# Patient Record
Sex: Male | Born: 1995 | Race: White | Hispanic: No | Marital: Single | Smoking: Current some day smoker
Health system: Southern US, Community
[De-identification: ages and names within clinical notes are randomized; demographics above are authoritative.]

## PROBLEM LIST (undated history)

## (undated) ENCOUNTER — Emergency Department (HOSPITAL_COMMUNITY): Admission: EM | Payer: Self-pay

## (undated) DIAGNOSIS — R112 Nausea with vomiting, unspecified: Secondary | ICD-10-CM

## (undated) DIAGNOSIS — Z789 Other specified health status: Secondary | ICD-10-CM

## (undated) DIAGNOSIS — Z9889 Other specified postprocedural states: Secondary | ICD-10-CM

## (undated) HISTORY — PX: MYRINGOTOMY: SUR874

---
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
## 2019-05-04 ENCOUNTER — Other Ambulatory Visit: Payer: Self-pay

## 2019-05-04 DIAGNOSIS — Z20822 Contact with and (suspected) exposure to covid-19: Secondary | ICD-10-CM

## 2019-05-05 LAB — NOVEL CORONAVIRUS, NAA: SARS-CoV-2, NAA: NOT DETECTED

## 2019-05-06 ENCOUNTER — Telehealth: Payer: Self-pay | Admitting: *Deleted

## 2019-05-06 NOTE — Telephone Encounter (Signed)
Patient calling for results- notified negative. Patient was tested for low grade temp and sore throat. Patient reports symptoms are better now. Advised to monitor symptoms- contact PCP for follow up if needed, retest if necessary, and get flu shot when available.

## 2019-10-08 ENCOUNTER — Other Ambulatory Visit: Payer: Self-pay

## 2019-10-08 ENCOUNTER — Ambulatory Visit
Admission: EM | Admit: 2019-10-08 | Discharge: 2019-10-08 | Disposition: A | Discharge status: Home / Self Care | Payer: 59 | Attending: Physician Assistant | Admitting: Physician Assistant

## 2019-10-08 DIAGNOSIS — Z202 Contact with and (suspected) exposure to infections with a predominantly sexual mode of transmission: Secondary | ICD-10-CM | POA: Insufficient documentation

## 2019-10-08 MED ORDER — AZITHROMYCIN 500 MG PO TABS
1000.0000 mg | ORAL_TABLET | Freq: Once | ORAL | Status: AC
  Administered 2019-10-08: 16:00:00 1000 mg via ORAL

## 2019-10-08 NOTE — ED Triage Notes (Signed)
Pt states his gf is chlamydia positive, pt denies any sx's

## 2019-10-08 NOTE — Discharge Instructions (Addendum)
You were treated empirically for chlamydia. Azithromycin 1000mg  given in office today. Cytology sent, you will be contacted with any positive results that requires further treatment. Refrain from sexual activity and alcohol use for the next 7 days. If developing testicular swelling/pain, penile lesion/sore, follow up for reevaluation.

## 2019-10-08 NOTE — ED Provider Notes (Signed)
[facility]    CSN: [id number] Arrival date & time: 10/08/19  1514      History   Chief Complaint Chief Complaint  Patient presents with  . Exposure to STD    HPI David Daugherty is a 24 y.o. male.   23 comes in for STD testing. Girlfriend tested positive for chlamydia. He is asymptomatic. Denies fever, chills, body aches. Denies abdominal pain, nausea, vomiting. Denies urinary symptoms such as frequency, dysuria, hematuria. Denies penile discharge, penile sore/ulcer, testicular swelling, testicular pain. Sexually active with one male partner.       History reviewed. No pertinent past medical history.  There are no problems to display for this patient.   Past Surgical History:  Procedure Laterality Date  . MYRINGOTOMY         Home Medications    Prior to Admission medications   Not on File    Family History Family History  Problem Relation Age of Onset  . Diabetes Mother     Social History Social History   Tobacco Use  . Smoking status: Current Every Day Smoker  . Smokeless tobacco: Never Used  Substance Use Topics  . Alcohol use: No  . Drug use: No     Allergies   Bee venom   Review of Systems Review of Systems  Reason unable to perform ROS: See HPI as above.     Physical Exam Triage Vital Signs ED Triage Vitals  Enc Vitals Group     BP 10/08/19 1525 (!) 152/74     Pulse Rate 10/08/19 1525 83     Resp 10/08/19 1525 16     Temp 10/08/19 1525 98.4 F (36.9 C)     Temp Source 10/08/19 1525 Oral     SpO2 10/08/19 1525 98 %     Weight --      Height --      Head Circumference --      Peak Flow --      Pain Score 10/08/19 1533 0     Pain Loc --      Pain Edu? --      Excl. in GC? --    No data found.  Updated Vital Signs BP (!) 152/74 (BP Location: Left Arm)   Pulse 83   Temp 98.4 F (36.9 C) (Oral)   Resp 16   SpO2 98%   Physical Exam Constitutional:      General: He is not in acute distress.  Appearance: Normal appearance. He is well-developed. He is not toxic-appearing or diaphoretic.  HENT:     Head: Normocephalic and atraumatic.  Eyes:     Conjunctiva/sclera: Conjunctivae normal.     Pupils: Pupils are equal, round, and reactive to light.  Pulmonary:     Effort: Pulmonary effort is normal. No respiratory distress.     Comments: Speaking in full sentences without difficulty Musculoskeletal:     Cervical back: Normal range of motion and neck supple.  Skin:    General: Skin is warm and dry.  Neurological:     Mental Status: He is alert and oriented to person, place, and time.    UC Treatments / Results  Labs (all labs ordered are listed, but only abnormal results are displayed) Labs Reviewed  CYTOLOGY, (ORAL, ANAL, URETHRAL) ANCILLARY ONLY    EKG   Radiology No results found.  Procedures Procedures (including critical care time)  Medications Ordered in UC Medications  azithromycin (ZITHROMAX) tablet 1,000 mg (1,000 mg Oral Given  10/08/19 1550)    Initial Impression / Assessment and Plan / UC Course  I have reviewed the triage vital signs and the nursing notes.  Pertinent labs & imaging results that were available during my care of the patient were reviewed by me and considered in my medical decision making (see chart for details).    Patient was treated empirically for chlamydia. Azithromycin given in office today. Cytology sent, patient will be contacted with any positive results that require additional treatment. Patient to refrain from sexual activity for the next 7 days. Return precautions given.   Final Clinical Impressions(s) / UC Diagnoses   Final diagnoses:  STD exposure   ED Prescriptions    None     PDMP not reviewed this encounter.   Ok Edwards, PA-C 10/08/19 1554

## 2019-10-12 ENCOUNTER — Telehealth (HOSPITAL_COMMUNITY): Payer: Self-pay | Admitting: Emergency Medicine

## 2019-10-12 LAB — CYTOLOGY, (ORAL, ANAL, URETHRAL) ANCILLARY ONLY
Chlamydia: POSITIVE — AB
Neisseria Gonorrhea: NEGATIVE
Trichomonas: NEGATIVE

## 2019-10-12 NOTE — Telephone Encounter (Signed)
Chlamydia is positive.  This was treated at the urgent care visit with po zithromax 1g.  Please refrain from sexual intercourse for 7 days to give the medicine time to work.  Sexual partners need to be notified and tested/treated.  Condoms may reduce risk of reinfection.  Recheck or followup with PCP for further evaluation if symptoms are not improving.  [facility] notified.  Attempted to reach patient. No answer at this time. Voicemail left.

## 2019-10-12 NOTE — Telephone Encounter (Signed)
Patient contacted by phone and made aware of  cytology  results. Pt verbalized understanding and had all questions answered.    

## 2019-12-18 ENCOUNTER — Ambulatory Visit
Admission: EM | Admit: 2019-12-18 | Discharge: 2019-12-18 | Disposition: A | Discharge status: Home / Self Care | Payer: 59 | Attending: Physician Assistant | Admitting: Physician Assistant

## 2019-12-18 ENCOUNTER — Other Ambulatory Visit: Payer: Self-pay

## 2019-12-18 DIAGNOSIS — T1592XA Foreign body on external eye, part unspecified, left eye, initial encounter: Secondary | ICD-10-CM | POA: Diagnosis not present

## 2019-12-18 MED ORDER — OFLOXACIN 0.3 % OP SOLN: 1.0000 [drp] | OPHTHALMIC | 0 refills | Status: AC

## 2019-12-18 MED ORDER — SYSTANE 0.4-0.3 % OP GEL: 1.0000 "application " | Freq: Every evening | OPHTHALMIC | 0 refills | Status: AC | PRN

## 2019-12-18 MED ORDER — TETRACAINE HCL 0.5 % OP SOLN
1.0000 [drp] | Freq: Once | OPHTHALMIC | Status: AC
  Administered 2019-12-18: 14:00:00 1 [drp] via OPHTHALMIC

## 2019-12-18 NOTE — ED Provider Notes (Addendum)
[facility]    CSN: [id number] Arrival date & time: 12/18/19  1318      History   Chief Complaint Chief Complaint  Patient presents with  . Eye Problem    foreign body     HPI David Daugherty is a 24 y.o. male.   24 year old male comes in for 3-day history of possible foreign body in the left eye.  Was working in the car when he felt foreign body sensation to the left eye, eye pain, photophobia.  Denies vision changes.  States now with eye redness, and watering.  Tried irrigating eye with tap water without relief.  Denies contact lens wear glasses use.     History reviewed. No pertinent past medical history.  There are no problems to display for this patient.   Past Surgical History:  Procedure Laterality Date  . MYRINGOTOMY         Home Medications    Prior to Admission medications   Medication Sig Start Date End Date Taking? Authorizing Provider  ofloxacin (OCUFLOX) 0.3 % ophthalmic solution Place 1 drop into the left eye every 2 (two) hours. 12/18/19   Cathie Hoops, Genia Perin V, PA-C  Polyethyl Glycol-Propyl Glycol (SYSTANE) 0.4-0.3 % GEL ophthalmic gel Place 1 application into both eyes at bedtime as needed. 12/18/19   Belinda Fisher, PA-C    Family History Family History  Problem Relation Age of Onset  . Diabetes Mother     Social History Social History   Tobacco Use  . Smoking status: Current Some Day Smoker  . Smokeless tobacco: Never Used  Substance Use Topics  . Alcohol use: Yes    Comment: occassional  . Drug use: No     Allergies   Bee venom   Review of Systems Review of Systems  Reason unable to perform ROS: See HPI as above.     Physical Exam Triage Vital Signs ED Triage Vitals  Enc Vitals Group     BP 12/18/19 1323 128/88     Pulse Rate 12/18/19 1323 84     Resp 12/18/19 1323 16     Temp 12/18/19 1323 98.3 F (36.8 C)     Temp Source 12/18/19 1323 Oral     SpO2 12/18/19 1323 97 %     Weight --      Height --      Head Circumference  --      Peak Flow --      Pain Score 12/18/19 1331 1     Pain Loc --      Pain Edu? --      Excl. in GC? --    No data found.  Updated Vital Signs BP 128/88 (BP Location: Left Arm)   Pulse 84   Temp 98.3 F (36.8 C) (Oral)   Resp 16   SpO2 97%   Visual Acuity Right Eye Distance:   Left Eye Distance:   Bilateral Distance:    Right Eye Near: R Near: 20/30 Left Eye Near:  L Near: 20/25 Bilateral Near:  20/20  Physical Exam Constitutional:      General: He is not in acute distress.    Appearance: He is well-developed.  HENT:     Head: Normocephalic and atraumatic.  Eyes:     Extraocular Movements: Extraocular movements intact.     Conjunctiva/sclera:     Right eye: Right conjunctiva is not injected.     Left eye: Left conjunctiva is injected.     Pupils: Pupils  are equal, round, and reactive to light.     Comments: Foreign body without obvious rust ring to left eye 10 o'clock region of the iris, this does not extend to the pupil.  Foreign body approximately 0.2 cm.  Fluorescein stain without uptake.  Negative Seidel sign  Musculoskeletal:     Cervical back: Normal range of motion and neck supple.  Skin:    General: Skin is warm and dry.  Neurological:     Mental Status: He is alert and oriented to person, place, and time.      UC Treatments / Results  Labs (all labs ordered are listed, but only abnormal results are displayed) Labs Reviewed - No data to display  EKG   Radiology No results found.  Procedures Procedures (including critical care time)  Medications Ordered in UC Medications  tetracaine (PONTOCAINE) 0.5 % ophthalmic solution 1 drop (has no administration in time range)    Initial Impression / Assessment and Plan / UC Course  I have reviewed the triage vital signs and the nursing notes.  Pertinent labs & imaging results that were available during my care of the patient were reviewed by me and considered in my medical decision making (see  chart for details).    Attempted foreign body removal with cotton swab without success.  Tetracaine drop given in office for symptomatic management.  Will start patient on ofloxacin every 2 hours while awake until seen by ophthalmology in 2 days for foreign body removal.  Other symptomatic treatment discussed.  Return precautions given.  Patient expresses understanding and agrees to plan.  Final Clinical Impressions(s) / UC Diagnoses   Final diagnoses:  Foreign body, eye, left, initial encounter   ED Prescriptions    Medication Sig Dispense Auth. Provider   ofloxacin (OCUFLOX) 0.3 % ophthalmic solution Place 1 drop into the left eye every 2 (two) hours. 5 mL Jodell Weitman V, PA-C   Polyethyl Glycol-Propyl Glycol (SYSTANE) 0.4-0.3 % GEL ophthalmic gel Place 1 application into both eyes at bedtime as needed. 8 mL Ok Edwards, PA-C     PDMP not reviewed this encounter.   Ok Edwards, PA-C 12/18/19 1356    Cathlean Sauer V, PA-C 12/18/19 1400

## 2019-12-18 NOTE — ED Triage Notes (Signed)
Patient presents with complaint of foreign body in left eye. He reports he was working on a car on Thursday morning when it occurred. Left eye now watery and feels irritated.

## 2019-12-18 NOTE — Discharge Instructions (Signed)
Use ofloxacin eyedrops as directed on left eye. Artificial tear gel at night. Wait 10-15 minutes between drops, always use artificial tear gel last, as it prevents drops from penetrating through. Monitor for any worsening of symptoms, changes in vision, sensitivity to light, eye swelling, painful eye movement, follow up with ophthalmology for further evaluation. Follow up with ophthalmology in 2 days for foreign body removal.

## 2022-12-02 ENCOUNTER — Other Ambulatory Visit: Payer: Self-pay | Admitting: Podiatry

## 2022-12-02 DIAGNOSIS — S93325A Dislocation of tarsometatarsal joint of left foot, initial encounter: Secondary | ICD-10-CM

## 2022-12-03 ENCOUNTER — Ambulatory Visit
Admission: RE | Admit: 2022-12-03 | Discharge: 2022-12-03 | Disposition: A | Discharge status: Home / Self Care | Payer: 59 | Source: Ambulatory Visit | Attending: Podiatry | Admitting: Podiatry

## 2022-12-03 DIAGNOSIS — S93325A Dislocation of tarsometatarsal joint of left foot, initial encounter: Secondary | ICD-10-CM

## 2022-12-04 ENCOUNTER — Other Ambulatory Visit: Payer: Self-pay | Admitting: Podiatry

## 2022-12-06 ENCOUNTER — Other Ambulatory Visit: Payer: Self-pay

## 2022-12-06 ENCOUNTER — Ambulatory Visit: Payer: 59 | Admitting: Anesthesiology

## 2022-12-06 ENCOUNTER — Ambulatory Visit: Payer: 59

## 2022-12-06 ENCOUNTER — Encounter
Admission: RE | Disposition: A | Discharge status: Home / Self Care | Payer: Self-pay | Source: Home / Self Care | Attending: Podiatry

## 2022-12-06 ENCOUNTER — Ambulatory Visit
Admission: RE | Admit: 2022-12-06 | Discharge: 2022-12-06 | Disposition: A | Discharge status: Home / Self Care | Payer: 59 | Attending: Podiatry | Admitting: Podiatry

## 2022-12-06 ENCOUNTER — Encounter: Payer: Self-pay | Admitting: Podiatry

## 2022-12-06 DIAGNOSIS — S92352A Displaced fracture of fifth metatarsal bone, left foot, initial encounter for closed fracture: Secondary | ICD-10-CM | POA: Diagnosis not present

## 2022-12-06 DIAGNOSIS — S92332A Displaced fracture of third metatarsal bone, left foot, initial encounter for closed fracture: Secondary | ICD-10-CM | POA: Diagnosis present

## 2022-12-06 DIAGNOSIS — X58XXXA Exposure to other specified factors, initial encounter: Secondary | ICD-10-CM | POA: Insufficient documentation

## 2022-12-06 DIAGNOSIS — Z01818 Encounter for other preprocedural examination: Secondary | ICD-10-CM

## 2022-12-06 DIAGNOSIS — F17200 Nicotine dependence, unspecified, uncomplicated: Secondary | ICD-10-CM | POA: Diagnosis not present

## 2022-12-06 DIAGNOSIS — S8252XA Displaced fracture of medial malleolus of left tibia, initial encounter for closed fracture: Secondary | ICD-10-CM | POA: Diagnosis not present

## 2022-12-06 DIAGNOSIS — S92342A Displaced fracture of fourth metatarsal bone, left foot, initial encounter for closed fracture: Secondary | ICD-10-CM | POA: Diagnosis not present

## 2022-12-06 DIAGNOSIS — S92322A Displaced fracture of second metatarsal bone, left foot, initial encounter for closed fracture: Secondary | ICD-10-CM | POA: Insufficient documentation

## 2022-12-06 DIAGNOSIS — S8252XD Displaced fracture of medial malleolus of left tibia, subsequent encounter for closed fracture with routine healing: Secondary | ICD-10-CM

## 2022-12-06 DIAGNOSIS — S93325A Dislocation of tarsometatarsal joint of left foot, initial encounter: Secondary | ICD-10-CM | POA: Insufficient documentation

## 2022-12-06 DIAGNOSIS — S93325D Dislocation of tarsometatarsal joint of left foot, subsequent encounter: Secondary | ICD-10-CM

## 2022-12-06 HISTORY — DX: Other specified postprocedural states: Z98.890

## 2022-12-06 HISTORY — PX: OPEN REDUCTION INTERNAL FIXATION (ORIF) FOOT LISFRANC FRACTURE: SHX5990

## 2022-12-06 HISTORY — DX: Nausea with vomiting, unspecified: R11.2

## 2022-12-06 HISTORY — PX: ORIF ANKLE FRACTURE: SHX5408

## 2022-12-06 SURGERY — OPEN REDUCTION INTERNAL FIXATION (ORIF) ANKLE FRACTURE
Anesthesia: Regional | Site: Foot | Laterality: Left

## 2022-12-06 MED ORDER — ONDANSETRON HCL 4 MG/2ML IJ SOLN
INTRAMUSCULAR | Status: DC | PRN
  Administered 2022-12-06: 4 mg via INTRAVENOUS

## 2022-12-06 MED ORDER — ORAL CARE MOUTH RINSE: 15.0000 mL | Freq: Once | OROMUCOSAL | Status: AC

## 2022-12-06 MED ORDER — DEXAMETHASONE SODIUM PHOSPHATE 10 MG/ML IJ SOLN
INTRAMUSCULAR | Status: DC | PRN
  Administered 2022-12-06: 10 mg via INTRAVENOUS

## 2022-12-06 MED ORDER — ONDANSETRON HCL 4 MG/2ML IJ SOLN
4.0000 mg | Freq: Once | INTRAMUSCULAR | Status: AC | PRN
  Administered 2022-12-06: 4 mg via INTRAVENOUS

## 2022-12-06 MED ORDER — ACETAMINOPHEN 10 MG/ML IV SOLN
INTRAVENOUS | Status: DC | PRN
  Administered 2022-12-06: 1000 mg via INTRAVENOUS

## 2022-12-06 MED ORDER — LIDOCAINE HCL (PF) 1 % IJ SOLN
INTRAMUSCULAR | Status: AC
  Filled 2022-12-06: qty 30

## 2022-12-06 MED ORDER — DEXAMETHASONE SODIUM PHOSPHATE 10 MG/ML IJ SOLN
INTRAMUSCULAR | Status: AC
  Filled 2022-12-06: qty 1

## 2022-12-06 MED ORDER — LIDOCAINE HCL (PF) 2 % IJ SOLN
INTRAMUSCULAR | Status: AC
  Filled 2022-12-06: qty 5

## 2022-12-06 MED ORDER — FENTANYL CITRATE PF 50 MCG/ML IJ SOSY
PREFILLED_SYRINGE | INTRAMUSCULAR | Status: AC
  Administered 2022-12-06: 50 ug via INTRAVENOUS
  Filled 2022-12-06: qty 1

## 2022-12-06 MED ORDER — FENTANYL CITRATE (PF) 100 MCG/2ML IJ SOLN
INTRAMUSCULAR | Status: AC
  Filled 2022-12-06: qty 2

## 2022-12-06 MED ORDER — OXYCODONE HCL 5 MG PO TABS: 5.0000 mg | ORAL_TABLET | Freq: Once | ORAL | Status: DC | PRN

## 2022-12-06 MED ORDER — BACITRACIN ZINC 500 UNIT/GM EX OINT
TOPICAL_OINTMENT | CUTANEOUS | Status: DC | PRN
  Administered 2022-12-06: 1 via TOPICAL

## 2022-12-06 MED ORDER — MIDAZOLAM HCL 2 MG/2ML IJ SOLN
INTRAMUSCULAR | Status: AC
  Filled 2022-12-06: qty 2

## 2022-12-06 MED ORDER — ROCURONIUM BROMIDE 100 MG/10ML IV SOLN
INTRAVENOUS | Status: DC | PRN
  Administered 2022-12-06: 50 mg via INTRAVENOUS
  Administered 2022-12-06 (×2): 20 mg via INTRAVENOUS
  Administered 2022-12-06: 30 mg via INTRAVENOUS
  Administered 2022-12-06: 20 mg via INTRAVENOUS

## 2022-12-06 MED ORDER — SUGAMMADEX SODIUM 200 MG/2ML IV SOLN
INTRAVENOUS | Status: DC | PRN
  Administered 2022-12-06: 200 mg via INTRAVENOUS

## 2022-12-06 MED ORDER — BUPIVACAINE HCL (PF) 0.5 % IJ SOLN
INTRAMUSCULAR | Status: AC
  Filled 2022-12-06: qty 30

## 2022-12-06 MED ORDER — DROPERIDOL 2.5 MG/ML IJ SOLN
INTRAMUSCULAR | Status: AC
  Filled 2022-12-06: qty 2

## 2022-12-06 MED ORDER — MIDAZOLAM HCL 2 MG/2ML IJ SOLN
INTRAMUSCULAR | Status: AC
  Administered 2022-12-06: 1 mg via INTRAVENOUS
  Filled 2022-12-06: qty 2

## 2022-12-06 MED ORDER — PROPOFOL 10 MG/ML IV BOLUS
INTRAVENOUS | Status: AC
  Filled 2022-12-06: qty 20

## 2022-12-06 MED ORDER — FENTANYL CITRATE (PF) 100 MCG/2ML IJ SOLN: 25.0000 ug | INTRAMUSCULAR | Status: DC | PRN

## 2022-12-06 MED ORDER — BUPIVACAINE HCL (PF) 0.5 % IJ SOLN
INTRAMUSCULAR | Status: DC | PRN
  Administered 2022-12-06: 10 mL

## 2022-12-06 MED ORDER — BUPIVACAINE LIPOSOME 1.3 % IJ SUSP
INTRAMUSCULAR | Status: DC | PRN
  Administered 2022-12-06: 20 mL

## 2022-12-06 MED ORDER — CEFAZOLIN SODIUM-DEXTROSE 2-4 GM/100ML-% IV SOLN
INTRAVENOUS | Status: AC
  Filled 2022-12-06: qty 100

## 2022-12-06 MED ORDER — LACTATED RINGERS IV SOLN: INTRAVENOUS | Status: DC

## 2022-12-06 MED ORDER — ONDANSETRON HCL 4 MG PO TABS: 4.0000 mg | ORAL_TABLET | Freq: Three times a day (TID) | ORAL | 0 refills | Status: AC | PRN

## 2022-12-06 MED ORDER — ROCURONIUM BROMIDE 10 MG/ML (PF) SYRINGE
PREFILLED_SYRINGE | INTRAVENOUS | Status: AC
  Filled 2022-12-06: qty 20

## 2022-12-06 MED ORDER — SODIUM CHLORIDE (PF) 0.9 % IJ SOLN
INTRAMUSCULAR | Status: AC
  Filled 2022-12-06: qty 50

## 2022-12-06 MED ORDER — FENTANYL CITRATE (PF) 100 MCG/2ML IJ SOLN
INTRAMUSCULAR | Status: DC | PRN
  Administered 2022-12-06 (×2): 50 ug via INTRAVENOUS

## 2022-12-06 MED ORDER — DEXMEDETOMIDINE HCL IN NACL 200 MCG/50ML IV SOLN
INTRAVENOUS | Status: DC | PRN
  Administered 2022-12-06: 4 ug via INTRAVENOUS
  Administered 2022-12-06 (×3): 8 ug via INTRAVENOUS

## 2022-12-06 MED ORDER — ONDANSETRON HCL 4 MG/2ML IJ SOLN
INTRAMUSCULAR | Status: AC
  Filled 2022-12-06: qty 2

## 2022-12-06 MED ORDER — ASPIRIN 81 MG PO TBEC: 81.0000 mg | DELAYED_RELEASE_TABLET | Freq: Two times a day (BID) | ORAL | 0 refills | Status: AC

## 2022-12-06 MED ORDER — 0.9 % SODIUM CHLORIDE (POUR BTL) OPTIME
TOPICAL | Status: DC | PRN
  Administered 2022-12-06: 1000 mL

## 2022-12-06 MED ORDER — CHLORHEXIDINE GLUCONATE 0.12 % MT SOLN: 15.0000 mL | Freq: Once | OROMUCOSAL | Status: AC

## 2022-12-06 MED ORDER — CHLORHEXIDINE GLUCONATE 0.12 % MT SOLN
OROMUCOSAL | Status: AC
  Administered 2022-12-06: 15 mL via OROMUCOSAL
  Filled 2022-12-06: qty 15

## 2022-12-06 MED ORDER — ACETAMINOPHEN 10 MG/ML IV SOLN: 1000.0000 mg | Freq: Once | INTRAVENOUS | Status: DC | PRN

## 2022-12-06 MED ORDER — OXYCODONE-ACETAMINOPHEN 5-325 MG PO TABS: 1.0000 | ORAL_TABLET | Freq: Four times a day (QID) | ORAL | 0 refills | Status: AC | PRN

## 2022-12-06 MED ORDER — OXYCODONE HCL 5 MG/5ML PO SOLN: 5.0000 mg | Freq: Once | ORAL | Status: DC | PRN

## 2022-12-06 MED ORDER — FENTANYL CITRATE PF 50 MCG/ML IJ SOSY: 50.0000 ug | PREFILLED_SYRINGE | Freq: Once | INTRAMUSCULAR | Status: AC

## 2022-12-06 MED ORDER — PROMETHAZINE HCL 25 MG/ML IJ SOLN: 6.2500 mg | Freq: Once | INTRAMUSCULAR | Status: DC | PRN

## 2022-12-06 MED ORDER — LIDOCAINE HCL (CARDIAC) PF 100 MG/5ML IV SOSY
PREFILLED_SYRINGE | INTRAVENOUS | Status: DC | PRN
  Administered 2022-12-06: 80 mg via INTRAVENOUS

## 2022-12-06 MED ORDER — CEFAZOLIN SODIUM-DEXTROSE 2-4 GM/100ML-% IV SOLN
2.0000 g | INTRAVENOUS | Status: AC
  Administered 2022-12-06 (×2): 2 g via INTRAVENOUS

## 2022-12-06 MED ORDER — DEXMEDETOMIDINE HCL IN NACL 80 MCG/20ML IV SOLN
INTRAVENOUS | Status: AC
  Filled 2022-12-06: qty 20

## 2022-12-06 MED ORDER — MIDAZOLAM HCL 2 MG/2ML IJ SOLN
1.0000 mg | INTRAMUSCULAR | Status: AC | PRN
  Administered 2022-12-06: 1 mg via INTRAVENOUS

## 2022-12-06 MED ORDER — PROPOFOL 10 MG/ML IV BOLUS
INTRAVENOUS | Status: DC | PRN
  Administered 2022-12-06: 150 mg via INTRAVENOUS

## 2022-12-06 MED ORDER — BUPIVACAINE LIPOSOME 1.3 % IJ SUSP
INTRAMUSCULAR | Status: AC
  Filled 2022-12-06: qty 20

## 2022-12-06 MED ORDER — ACETAMINOPHEN 10 MG/ML IV SOLN
INTRAVENOUS | Status: AC
  Filled 2022-12-06: qty 100

## 2022-12-06 MED ORDER — BUPIVACAINE HCL (PF) 0.5 % IJ SOLN
INTRAMUSCULAR | Status: AC
  Filled 2022-12-06: qty 10

## 2022-12-06 MED ORDER — DROPERIDOL 2.5 MG/ML IJ SOLN
0.6250 mg | Freq: Once | INTRAMUSCULAR | Status: AC
  Administered 2022-12-06: 0.625 mg via INTRAVENOUS

## 2022-12-06 SURGICAL SUPPLY — 73 items
3.5 Countersink IMPLANT
BIT DRILL 1.3 (BIT) ×2
BIT DRILL 100X1.3XAO CNCT (BIT) IMPLANT
BIT DRILL CANNULATED 2.3MM (DRILL) IMPLANT
BIT DRILL CANNULTD 2.6 X 130MM (DRILL) IMPLANT
BIT DRL 100X1.3XAO CNCT (BIT) ×2
BLADE SURG 15 STRL LF DISP TIS (BLADE) IMPLANT
BLADE SURG 15 STRL SS (BLADE) ×2
BNDG CMPR 5X4 CHSV STRCH STRL (GAUZE/BANDAGES/DRESSINGS) ×2
BNDG CMPR 75X41 PLY HI ABS (GAUZE/BANDAGES/DRESSINGS) ×2
BNDG CMPR STD VLCR NS LF 5.8X4 (GAUZE/BANDAGES/DRESSINGS) ×2
BNDG COHESIVE 4X5 TAN STRL LF (GAUZE/BANDAGES/DRESSINGS) ×2 IMPLANT
BNDG ELASTIC 4X5.8 VLCR NS LF (GAUZE/BANDAGES/DRESSINGS) ×2 IMPLANT
BNDG ELASTIC 4X5.8 VLCR STR LF (GAUZE/BANDAGES/DRESSINGS) IMPLANT
BNDG ESMARCH 4 X 12 STRL LF (GAUZE/BANDAGES/DRESSINGS) ×2
BNDG ESMARCH 4X12 STRL LF (GAUZE/BANDAGES/DRESSINGS) ×2 IMPLANT
BNDG GAUZE DERMACEA FLUFF 4 (GAUZE/BANDAGES/DRESSINGS) ×2 IMPLANT
BNDG GZE 12X3 1 PLY HI ABS (GAUZE/BANDAGES/DRESSINGS) ×4
BNDG GZE DERMACEA 4 6PLY (GAUZE/BANDAGES/DRESSINGS)
BNDG STRETCH 4X75 STRL LF (GAUZE/BANDAGES/DRESSINGS) IMPLANT
BNDG STRETCH GAUZE 3IN X12FT (GAUZE/BANDAGES/DRESSINGS) ×2 IMPLANT
COUNTERSICK 4.0 HEADED (MISCELLANEOUS) ×2
COUNTERSINK HEADED 3.5 (ORTHOPEDIC DISPOSABLE SUPPLIES) ×2
DRAPE C-ARM XRAY 36X54 (DRAPES) ×2 IMPLANT
DRAPE C-ARMOR (DRAPES) ×2 IMPLANT
DRILL CANNULATED 2.3MM (DRILL) ×2
DRILL CANNULATED 2.6 X 130MM (DRILL) ×2
DURAPREP 26ML APPLICATOR (WOUND CARE) ×2 IMPLANT
ELECT REM PT RETURN 9FT ADLT (ELECTROSURGICAL) ×2
ELECTRODE REM PT RTRN 9FT ADLT (ELECTROSURGICAL) ×2 IMPLANT
GAUZE SPONGE 4X4 12PLY STRL (GAUZE/BANDAGES/DRESSINGS) ×2 IMPLANT
GAUZE XEROFORM 1X8 LF (GAUZE/BANDAGES/DRESSINGS) ×2 IMPLANT
GLOVE BIO SURGEON STRL SZ7 (GLOVE) ×2 IMPLANT
GLOVE INDICATOR 7.0 STRL GRN (GLOVE) ×2 IMPLANT
GOWN STRL REUS W/ TWL LRG LVL3 (GOWN DISPOSABLE) ×6 IMPLANT
GOWN STRL REUS W/TWL LRG LVL3 (GOWN DISPOSABLE) ×6
K-WIRE SMOOTH 1.6X150MM (WIRE) ×6
K-WIRE SNGL END 1.2X150 (MISCELLANEOUS) ×10
KIT TURNOVER KIT A (KITS) ×2 IMPLANT
KWIRE SMOOTH 1.6X150MM (WIRE) IMPLANT
KWIRE SNGL END 1.2X150 (MISCELLANEOUS) IMPLANT
LABEL OR SOLS (LABEL) ×2 IMPLANT
LOOP VESSEL MINI 0.8X406 BLUE (MISCELLANEOUS) IMPLANT
MANIFOLD NEPTUNE II (INSTRUMENTS) ×2 IMPLANT
NS IRRIG 500ML POUR BTL (IV SOLUTION) ×2 IMPLANT
PACK EXTREMITY ARMC (MISCELLANEOUS) ×2 IMPLANT
PAD ABD DERMACEA PRESS 5X9 (GAUZE/BANDAGES/DRESSINGS) IMPLANT
PAD PREP 24X41 OB/GYN DISP (PERSONAL CARE ITEMS) ×2 IMPLANT
PIN BALLS 3/8 F/.045 WIRE (MISCELLANEOUS) IMPLANT
PLATE T 13 HOLE (Plate) IMPLANT
SCREW 4.0X50 LONG THREAD (Screw) IMPLANT
SCREW CANN HEAD ST 3.5X32 (Screw) IMPLANT
SCREW COUNTERSINK 4.0 HEADED (MISCELLANEOUS) IMPLANT
SCREW COUNTERSINK HEADED 3.5 (ORTHOPEDIC DISPOSABLE SUPPLIES) IMPLANT
SCREW HEAD SHORT 3.5X40 (Screw) IMPLANT
SCREW LOCK  2.0X11 (Screw) ×8 IMPLANT
SCREW LOCK 2.0X11 (Screw) IMPLANT
SCREW LOCKING 2.0X12 (Screw) IMPLANT
SCREW LOCKING 2.0X13 (Screw) IMPLANT
SCREW LOCKING 2.0X14 (Screw) IMPLANT
SPLINT CAST 1 STEP 4X30 (MISCELLANEOUS) IMPLANT
SPLINT PLASTER CAST FAST 5X30 (CAST SUPPLIES) IMPLANT
SPONGE T-LAP 18X18 ~~LOC~~+RFID (SPONGE) ×2 IMPLANT
STAPLER SKIN PROX 35W (STAPLE) ×2 IMPLANT
STOCKINETTE M/LG 89821 (MISCELLANEOUS) ×2 IMPLANT
STRAP SAFETY 5IN WIDE (MISCELLANEOUS) ×2 IMPLANT
SUT VIC AB 2-0 CT1 27 (SUTURE) ×2
SUT VIC AB 2-0 CT1 TAPERPNT 27 (SUTURE) ×2 IMPLANT
SUT VIC AB 3-0 SH 27 (SUTURE) ×8
SUT VIC AB 3-0 SH 27X BRD (SUTURE) ×2 IMPLANT
TRAP FLUID SMOKE EVACUATOR (MISCELLANEOUS) ×2 IMPLANT
WATER STERILE IRR 500ML POUR (IV SOLUTION) ×2 IMPLANT
WIRE OLIVE SMOOTH 1.3 (WIRE) IMPLANT

## 2022-12-06 NOTE — Anesthesia Procedure Notes (Signed)
Anesthesia Regional Block: Popliteal block   Pre-Anesthetic Checklist: , timeout performed,  Correct Patient, Correct Site, Correct Laterality,  Correct Procedure,, risks and benefits discussed,  Surgical consent,  Pre-op evaluation,  At surgeon's request and post-op pain management  Laterality: Left  Prep: chloraprep       Needles:  Injection technique: Single-shot  Needle Type: Echogenic Needle          Additional Needles:   Procedures:,,,, ultrasound used (permanent image in chart),,   Motor weakness within 20 minutes.  Narrative:  Start time: 12/06/2022 7:29 AM End time: 12/06/2022 7:31 AM Injection made incrementally with aspirations every 5 mL.  Performed by: Personally  Anesthesiologist: Darrin Nipper, MD  Additional Notes: Functioning IV was confirmed and monitors applied.  Sterile prep and drape, hand hygiene and sterile gloves were used. Ultrasound guidance: relevant anatomy identified, needle position confirmed, local anesthetic spread visualized around nerve(s), vascular puncture avoided.  Image saved to electronic medical record.  Negative aspiration prior to incremental administration of local anesthetic for total 10 ml Exparel and 10 ml bupivacaine 0.5% given in popliteal sciatic distribution. The patient tolerated the procedure well. Vital signs and moderate sedation medications recorded in RN notes.

## 2022-12-06 NOTE — H&P (Signed)
HISTORY AND PHYSICAL INTERVAL NOTE:  12/06/2022  7:20 AM  David Daugherty  has presented today for surgery, with the diagnosis of S93.325 - Lisfranc dislocation, left S82.52XA - Closed displaced fracture of medial malleolus of left tibia M25.572 - Acute left ankle pain M79.672 - Left foot pain.  The various methods of treatment have been discussed with the patient.  No guarantees were given.  After consideration of risks, benefits and other options for treatment, the patient has consented to surgery.  I have reviewed the patients' chart and labs.    PROCEDURE: ALL LEFT FOOT/ANKLE MEDIAL MALLEOLAR FRACTURE ORIF LISFRANC FRACTURE DISLOCATION ORIF WITH POSSIBLE FUSION OF SECOND AND THIRD TARSOMETATARSAL JOINTS AND INTERCUNEIFORM JOINT  A history and physical examination was performed in my office.  The patient was reexamined.  There have been no changes to this history and physical examination.  Caroline More, DPM

## 2022-12-06 NOTE — Transfer of Care (Signed)
Immediate Anesthesia Transfer of Care Note  Patient: David Daugherty  Procedure(s) Performed: OPEN REDUCTION INTERNAL FIXATION (ORIF) ANKLE FRACTURE (Left: Ankle) OPEN REDUCTION INTERNAL FIXATION (ORIF) FOOT LISFRANC FRACTURE (Left: Foot)  Patient Location: PACU  Anesthesia Type:General  Level of Consciousness: awake and alert   Airway & Oxygen Therapy: Patient Spontanous Breathing and Patient connected to face mask oxygen  Post-op Assessment: Report given to RN and Post -op Vital signs reviewed and stable  Post vital signs: Reviewed and stable  Last Vitals:  Vitals Value Taken Time  BP 130/82 12/06/22 1207  Temp 36.6 C 12/06/22 1207  Pulse 82 12/06/22 1208  Resp 14 12/06/22 1208  SpO2 100 % 12/06/22 1208  Vitals shown include unvalidated device data.  Last Pain:  Vitals:   12/06/22 0616  TempSrc: Tympanic  PainSc: 2       Patients Stated Pain Goal: 1 ([id number])  Complications: No notable events documented.

## 2022-12-06 NOTE — Anesthesia Preprocedure Evaluation (Addendum)
Anesthesia Evaluation  Patient identified by MRN, date of birth, ID band Patient awake    Reviewed: Allergy & Precautions, NPO status , Patient's Chart, lab work & pertinent test results  History of Anesthesia Complications Negative for: history of anesthetic complications  Airway Mallampati: I   Neck ROM: Full    Dental no notable dental hx.    Pulmonary Current Smoker and Patient abstained from smoking.   Pulmonary exam normal breath sounds clear to auscultation       Cardiovascular Exercise Tolerance: Good negative cardio ROS Normal cardiovascular exam Rhythm:Regular Rate:Normal     Neuro/Psych negative neurological ROS     GI/Hepatic negative GI ROS,,,  Endo/Other  negative endocrine ROS    Renal/GU negative Renal ROS     Musculoskeletal   Abdominal   Peds  Hematology negative hematology ROS (+)   Anesthesia Other Findings   Reproductive/Obstetrics                             Anesthesia Physical Anesthesia Plan  ASA: 2  Anesthesia Plan: General and Regional   Post-op Pain Management: Regional block*   Induction: Intravenous  PONV Risk Score and Plan: 1 and Ondansetron, Dexamethasone and Treatment may vary due to age or medical condition  Airway Management Planned: Oral ETT  Additional Equipment:   Intra-op Plan:   Post-operative Plan: Extubation in OR  Informed Consent: I have reviewed the patients History and Physical, chart, labs and discussed the procedure including the risks, benefits and alternatives for the proposed anesthesia with the patient or authorized representative who has indicated his/her understanding and acceptance.     Dental advisory given  Plan Discussed with: CRNA  Anesthesia Plan Comments: (Plan for preoperative adductor saphenous and popliteal sciatic nerve blocks and GETA.  Patient consented for risks of anesthesia including but not limited  to:  - adverse reactions to medications - damage to eyes, teeth, lips or other oral mucosa - nerve damage due to positioning  - sore throat or hoarseness - damage to heart, brain, nerves, lungs, other parts of body or loss of life  Informed patient about role of CRNA in peri- and intra-operative care.  Patient voiced understanding.)       Anesthesia Quick Evaluation

## 2022-12-06 NOTE — Anesthesia Procedure Notes (Signed)
Procedure Name: Intubation Date/Time: 12/06/2022 7:46 AM  Performed by: Tollie Eth, CRNAPre-anesthesia Checklist: Patient identified, Patient being monitored, Timeout performed, Emergency Drugs available and Suction available Patient Re-evaluated:Patient Re-evaluated prior to induction Oxygen Delivery Method: Circle system utilized Preoxygenation: Pre-oxygenation with 100% oxygen Induction Type: IV induction Ventilation: Mask ventilation without difficulty Laryngoscope Size: 4 and McGraph Grade View: Grade I Tube type: Oral Tube size: 7.5 mm Number of attempts: 1 Airway Equipment and Method: Stylet Placement Confirmation: ETT inserted through vocal cords under direct vision, positive ETCO2 and breath sounds checked- equal and bilateral Secured at: 20 cm Tube secured with: Tape Dental Injury: Teeth and Oropharynx as per pre-operative assessment

## 2022-12-06 NOTE — Anesthesia Procedure Notes (Signed)
Anesthesia Regional Block: Adductor canal block   Pre-Anesthetic Checklist: , timeout performed,  Correct Patient, Correct Site, Correct Laterality,  Correct Procedure,, risks and benefits discussed,  Surgical consent,  Pre-op evaluation,  At surgeon's request and post-op pain management  Laterality: Left  Prep: chloraprep       Needles:  Injection technique: Single-shot  Needle Type: Echogenic Needle          Additional Needles:   Procedures:,,,, ultrasound used (permanent image in chart),,   Motor weakness within 20 minutes.  Narrative:  Start time: 12/06/2022 7:31 AM End time: 12/06/2022 7:33 AM Injection made incrementally with aspirations every 5 mL.  Performed by: Personally  Anesthesiologist: Darrin Nipper, MD  Additional Notes: Functioning IV was confirmed and monitors applied.  Sterile prep and drape, hand hygiene and sterile gloves were used. Ultrasound guidance: relevant anatomy identified, needle position confirmed, local anesthetic spread visualized around nerve(s), vascular puncture avoided.  Image saved to electronic medical record.  Negative aspiration prior to incremental administration of local anesthetic for total 10 ml Exparel given in adductor saphenous distribution. The patient tolerated the procedure well. Vital signs and moderate sedation medications recorded in RN notes.
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
## 2022-12-06 NOTE — Anesthesia Postprocedure Evaluation (Signed)
Anesthesia Post Note  Patient: David Daugherty  Procedure(s) Performed: OPEN REDUCTION INTERNAL FIXATION (ORIF) ANKLE FRACTURE (Left: Ankle) OPEN REDUCTION INTERNAL FIXATION (ORIF) FOOT LISFRANC FRACTURE (Left: Foot)  Patient location during evaluation: PACU Anesthesia Type: Regional Level of consciousness: awake and alert, oriented and patient cooperative Pain management: pain level controlled Vital Signs Assessment: post-procedure vital signs reviewed and stable Respiratory status: spontaneous breathing, nonlabored ventilation and respiratory function stable Cardiovascular status: blood pressure returned to baseline and stable Postop Assessment: adequate PO intake Anesthetic complications: no Comments: PONV controlled with droperidol.   No notable events documented.   Last Vitals:  Vitals:   12/06/22 1230 12/06/22 1245  BP: 129/81 130/73  Pulse: 95 82  Resp: 16 18  Temp: 36.6 C 36.6 C  SpO2: 100% 100%    Last Pain:  Vitals:   12/06/22 1230  TempSrc:   PainSc: 0-No pain                 Darrin Nipper

## 2022-12-06 NOTE — Discharge Instructions (Addendum)
[place]  POST OPERATIVE INSTRUCTIONS FOR DR. Vickki Muff AND DR. Ages   Take your medication as prescribed.  Pain medication should be taken only as needed.  You may take Tylenol between pain medication doses as needed.  Maximum dose of Tylenol per day is 4000 mg.  If pain is still severe and uncontrolled try taking 2 pain tablets every 6 hours, still severe then try taking 1 pain tablet every 4 hours, still severe try taking 2 pain tablets every 4 hours.  Contact physician if pain is still improved.  You should have postoperative pain with this but hopefully it is manageable with the pain medication will be prescribed.  Keep the dressing clean, dry and intact.  Remain nonweightbearing at all times to left lower extremity use a knee scooter, crutches, or wheelchair.  Keep your foot elevated above the heart level for the first 48 hours.  Continue elevation thereafter to improve swelling.  May also apply ice to the top of your left foot and ankle for maximum 10 minutes out of every 1 hour as needed.  Begin taking aspirin 81 mg twice daily starting tomorrow.  Continue taking antibiotic until gone.  Walking to the bathroom and brief periods of walking are acceptable, unless we have instructed you to be non-weight bearing.  Always use crutches, knee scooter, or wheelchair to get around to keep pressure off the left foot at all times.  Do not take a shower. Baths are permissible as long as the foot is kept out of the water.  Do not get dressings wet to the left foot or stand on left foot.  Every hour you are awake:  Bend your knee 15 times. Massage calf 15 times  Call [facility] [phone]) if any of the following problems occur: You develop a temperature or fever. The bandage becomes saturated with blood. Medication does not stop your pain. Injury of the foot occurs. Any symptoms of infection including  redness, odor, or red streaks running from wound.   AMBULATORY SURGERY  DISCHARGE INSTRUCTIONS   The drugs that you were given will stay in your system until tomorrow so for the next 24 hours you should not:  Drive an automobile Make any legal decisions Drink any alcoholic beverage   You may resume regular meals tomorrow.  Today it is better to start with liquids and gradually work up to solid foods.  You may eat anything you prefer, but it is better to start with liquids, then soup and crackers, and gradually work up to solid foods.   Please notify your doctor immediately if you have any unusual bleeding, trouble breathing, redness and pain at the surgery site, drainage, fever, or pain not relieved by medication.    Additional Instructions:

## 2022-12-09 ENCOUNTER — Encounter: Payer: Self-pay | Admitting: Podiatry

## 2023-02-17 ENCOUNTER — Other Ambulatory Visit: Payer: Self-pay | Admitting: Podiatry

## 2023-02-20 ENCOUNTER — Encounter: Payer: Self-pay | Admitting: Podiatry

## 2023-02-21 NOTE — Anesthesia Preprocedure Evaluation (Signed)
Anesthesia Evaluation  Patient identified by MRN, date of birth, ID band Patient awake    Reviewed: Allergy & Precautions, H&P , NPO status , Patient's Chart, lab work & pertinent test results  History of Anesthesia Complications (+) PONV and history of anesthetic complications  Airway Mallampati: II  TM Distance: >3 FB Neck ROM: Full    Dental no notable dental hx.    Pulmonary Current Smoker Stopped smoking and vaping    Pulmonary exam normal breath sounds clear to auscultation       Cardiovascular negative cardio ROS Normal cardiovascular exam Rhythm:Regular Rate:Normal     Neuro/Psych negative neurological ROS  negative psych ROS   GI/Hepatic negative GI ROS, Neg liver ROS,,,  Endo/Other  negative endocrine ROS    Renal/GU negative Renal ROS  negative genitourinary   Musculoskeletal negative musculoskeletal ROS (+)    Abdominal   Peds negative pediatric ROS (+)  Hematology negative hematology ROS (+)   Anesthesia Other Findings PONV, severe, plan TIVA  Reproductive/Obstetrics negative OB ROS                             Anesthesia Physical Anesthesia Plan  ASA: 1  Anesthesia Plan:    Post-op Pain Management:    Induction: Intravenous  PONV Risk Score and Plan:   Airway Management Planned: LMA  Additional Equipment:   Intra-op Plan:   Post-operative Plan: Extubation in OR  Informed Consent: I have reviewed the patients History and Physical, chart, labs and discussed the procedure including the risks, benefits and alternatives for the proposed anesthesia with the patient or authorized representative who has indicated his/her understanding and acceptance.     Dental Advisory Given  Plan Discussed with: Anesthesiologist, CRNA and Surgeon  Anesthesia Plan Comments: (Patient consented for risks of anesthesia including but not limited to:  - adverse reactions to  medications - damage to eyes, teeth, lips or other oral mucosa - nerve damage due to positioning  - sore throat or hoarseness - Damage to heart, brain, nerves, lungs, other parts of body or loss of life  Patient voiced understanding.)       Anesthesia Quick Evaluation

## 2023-02-26 NOTE — Discharge Instructions (Addendum)
[facility]  POST OPERATIVE INSTRUCTIONS FOR DR. Ether Griffins AND DR. Tove Wideman [facility] PODIATRY DEPARTMENT   Take your medication as prescribed.  Pain medication should be taken only as needed.  Keep the dressing clean, dry and intact.  You do not need to sleep in the boot but need to be wearing the boot when up and walking around for the next 2 weeks.  Keep your foot elevated above the heart level for the first 48 hours.  Continue elevation thereafter to improve swelling.  You may also apply ice to the top of your foot for maximum 10 minutes out of every 1 hour as needed.  Walking to the bathroom and brief periods of walking are acceptable, unless we have instructed you to be non-weight bearing.  Always wear your post-op shoe when walking.  Always use your crutches if you are to be non-weight bearing.  Do not take a shower. Baths are permissible as long as the foot is kept out of the water.   Every hour you are awake:  Bend your knee 15 times. Flex foot 15 times Massage calf 15 times  Call [facility] [phone]) if any of the following problems occur: You develop a temperature or fever. The bandage becomes saturated with blood. Medication does not stop your pain. Injury of the foot occurs. Any symptoms of infection including redness, odor, or red streaks running from wound.

## 2023-02-27 ENCOUNTER — Encounter
Admission: RE | Disposition: A | Discharge status: Home / Self Care | Payer: Self-pay | Source: Home / Self Care | Attending: Podiatry

## 2023-02-27 ENCOUNTER — Ambulatory Visit
Admission: RE | Admit: 2023-02-27 | Discharge: 2023-02-27 | Disposition: A | Discharge status: Home / Self Care | Payer: 59 | Attending: Podiatry | Admitting: Podiatry

## 2023-02-27 ENCOUNTER — Other Ambulatory Visit: Payer: Self-pay

## 2023-02-27 ENCOUNTER — Ambulatory Visit: Payer: 59 | Admitting: Anesthesiology

## 2023-02-27 DIAGNOSIS — X58XXXD Exposure to other specified factors, subsequent encounter: Secondary | ICD-10-CM | POA: Insufficient documentation

## 2023-02-27 DIAGNOSIS — Z969 Presence of functional implant, unspecified: Secondary | ICD-10-CM

## 2023-02-27 DIAGNOSIS — S92902D Unspecified fracture of left foot, subsequent encounter for fracture with routine healing: Secondary | ICD-10-CM | POA: Diagnosis present

## 2023-02-27 HISTORY — DX: Other specified health status: Z78.9

## 2023-02-27 HISTORY — PX: HARDWARE REMOVAL: SHX979

## 2023-02-27 SURGERY — REMOVAL, HARDWARE
Anesthesia: Choice | Site: Foot | Laterality: Left

## 2023-02-27 MED ORDER — 0.9 % SODIUM CHLORIDE (POUR BTL) OPTIME
TOPICAL | Status: DC | PRN
  Administered 2023-02-27: 60 mL

## 2023-02-27 MED ORDER — CEFAZOLIN SODIUM-DEXTROSE 2-4 GM/100ML-% IV SOLN
2.0000 g | INTRAVENOUS | Status: AC
  Administered 2023-02-27 (×2): 2 g via INTRAVENOUS

## 2023-02-27 MED ORDER — PROPOFOL 10 MG/ML IV BOLUS
INTRAVENOUS | Status: DC | PRN
  Administered 2023-02-27: 300 mg via INTRAVENOUS

## 2023-02-27 MED ORDER — BUPIVACAINE HCL (PF) 0.5 % IJ SOLN
INTRAMUSCULAR | Status: DC | PRN
  Administered 2023-02-27: 10 mL

## 2023-02-27 MED ORDER — PROPOFOL 500 MG/50ML IV EMUL
INTRAVENOUS | Status: DC | PRN
  Administered 2023-02-27: 175 ug/kg/min via INTRAVENOUS

## 2023-02-27 MED ORDER — HYDROCODONE-ACETAMINOPHEN 5-325 MG PO TABS: 1.0000 | ORAL_TABLET | Freq: Four times a day (QID) | ORAL | 0 refills | Status: AC | PRN

## 2023-02-27 MED ORDER — DEXAMETHASONE SODIUM PHOSPHATE 10 MG/ML IJ SOLN
INTRAMUSCULAR | Status: DC | PRN
  Administered 2023-02-27: 4 mg via INTRAVENOUS

## 2023-02-27 MED ORDER — ONDANSETRON HCL 4 MG/2ML IJ SOLN
INTRAMUSCULAR | Status: DC | PRN
  Administered 2023-02-27: 4 mg via INTRAVENOUS

## 2023-02-27 MED ORDER — KETOROLAC TROMETHAMINE 15 MG/ML IJ SOLN
INTRAMUSCULAR | Status: DC | PRN
  Administered 2023-02-27: 15 mg via INTRAVENOUS

## 2023-02-27 MED ORDER — LIDOCAINE HCL (CARDIAC) PF 100 MG/5ML IV SOSY
PREFILLED_SYRINGE | INTRAVENOUS | Status: DC | PRN
  Administered 2023-02-27: 60 mg via INTRAVENOUS

## 2023-02-27 MED ORDER — MIDAZOLAM HCL 2 MG/2ML IJ SOLN
INTRAMUSCULAR | Status: DC | PRN
  Administered 2023-02-27 (×2): 2 mg via INTRAVENOUS

## 2023-02-27 MED ORDER — ACETAMINOPHEN 10 MG/ML IV SOLN
INTRAVENOUS | Status: DC | PRN
  Administered 2023-02-27: 1000 mg via INTRAVENOUS

## 2023-02-27 MED ORDER — ONDANSETRON HCL 4 MG/2ML IJ SOLN
4.0000 mg | Freq: Once | INTRAMUSCULAR | Status: AC
  Administered 2023-02-27: 4 mg via INTRAVENOUS

## 2023-02-27 MED ORDER — LACTATED RINGERS IV SOLN: INTRAVENOUS | Status: DC

## 2023-02-27 MED ORDER — SCOPOLAMINE 1 MG/3DAYS TD PT72
1.0000 | MEDICATED_PATCH | Freq: Once | TRANSDERMAL | Status: DC
  Administered 2023-02-27: 1.5 mg via TRANSDERMAL

## 2023-02-27 SURGICAL SUPPLY — 30 items
BNDG CMPR 5X4 CHSV STRCH STRL (GAUZE/BANDAGES/DRESSINGS) ×2
BNDG CMPR STD VLCR NS LF 5.8X4 (GAUZE/BANDAGES/DRESSINGS) ×2
BNDG COHESIVE 4X5 TAN STRL LF (GAUZE/BANDAGES/DRESSINGS) ×2 IMPLANT
BNDG ELASTIC 4X5.8 VLCR NS LF (GAUZE/BANDAGES/DRESSINGS) ×2 IMPLANT
BNDG ESMARCH 4 X 12 STRL LF (GAUZE/BANDAGES/DRESSINGS) ×2
BNDG ESMARCH 4X12 STRL LF (GAUZE/BANDAGES/DRESSINGS) ×1 IMPLANT
BNDG GAUZE DERMACEA FLUFF 4 (GAUZE/BANDAGES/DRESSINGS) ×2 IMPLANT
BNDG GZE DERMACEA 4 6PLY (GAUZE/BANDAGES/DRESSINGS) ×2
DRAPE FLUOR MINI C-ARM 54X84 (DRAPES) ×2 IMPLANT
DURAPREP 26ML APPLICATOR (WOUND CARE) ×2 IMPLANT
ELECT REM PT RETURN 9FT ADLT (ELECTROSURGICAL) ×2
ELECTRODE REM PT RTRN 9FT ADLT (ELECTROSURGICAL) ×2 IMPLANT
GAUZE SPONGE 4X4 12PLY STRL (GAUZE/BANDAGES/DRESSINGS) ×2 IMPLANT
GAUZE XEROFORM 1X8 LF (GAUZE/BANDAGES/DRESSINGS) ×2 IMPLANT
GLOVE BIOGEL PI IND STRL 7.5 (GLOVE) ×4 IMPLANT
GLOVE SURG SS PI 7.0 STRL IVOR (GLOVE) ×4 IMPLANT
GOWN STRL REUS W/ TWL LRG LVL3 (GOWN DISPOSABLE) ×5 IMPLANT
GOWN STRL REUS W/TWL LRG LVL3 (GOWN DISPOSABLE) ×6
NDL HYPO 18GX1.5 BLUNT FILL (NEEDLE) IMPLANT
NDL HYPO 25GX1 SAFETY (NEEDLE) ×3 IMPLANT
NEEDLE HYPO 18GX1.5 BLUNT FILL (NEEDLE) ×2 IMPLANT
NEEDLE HYPO 25GX1 SAFETY (NEEDLE) ×2 IMPLANT
NS IRRIG 500ML POUR BTL (IV SOLUTION) ×2 IMPLANT
PACK EXTREMITY ARMC (MISCELLANEOUS) ×2 IMPLANT
PADDING CAST BLEND 4X4 NS (MISCELLANEOUS) ×6 IMPLANT
STOCKINETTE IMPERVIOUS LG (DRAPES) ×2 IMPLANT
SUT ETHILON 3-0 (SUTURE) ×1 IMPLANT
SUT VIC AB 3-0 SH 27 (SUTURE) ×2
SUT VIC AB 3-0 SH 27X BRD (SUTURE) ×1 IMPLANT
SYR 10ML LL (SYRINGE) ×2 IMPLANT

## 2023-02-27 NOTE — Transfer of Care (Signed)
Immediate Anesthesia Transfer of Care Note  Patient: David Daugherty  Procedure(s) Performed: MINOR HARDWARE REMOVAL - DEEP (Left: Foot)  Patient Location: PACU  Anesthesia Type:General  Level of Consciousness: awake and alert   Airway & Oxygen Therapy: Patient Spontanous Breathing  Post-op Assessment: Report given to RN and Post -op Vital signs reviewed and stable  Post vital signs: Reviewed and stable  Last Vitals:  Vitals Value Taken Time  BP 129/65 02/27/23 0940  Temp    Pulse 79 02/27/23 0942  Resp 15 02/27/23 0943  SpO2 100 % 02/27/23 0942  Vitals shown include unvalidated device data.  Last Pain:  Vitals:   02/27/23 0814  TempSrc: Temporal  PainSc: 0-No pain         Complications: No notable events documented.

## 2023-02-27 NOTE — Op Note (Signed)
PODIATRY / FOOT AND ANKLE SURGERY OPERATIVE REPORT    SURGEON: Rosetta Posner, DPM  PRE-OPERATIVE DIAGNOSIS:  1.  Left foot retained orthopedic hardware status post Lisfranc fracture dislocation  POST-OPERATIVE DIAGNOSIS: Same  PROCEDURE(S): Removal of hardware left foot  HEMOSTASIS: Left ankle tourniquet  ANESTHESIA: MAC  ESTIMATED BLOOD LOSS: 1 cc  FINDING(S): 1.  Retained hardware at the medial cuneiform from previous Lisfranc fracture dislocation, planned removal, no abnormalities present.  PATHOLOGY/SPECIMEN(S): Screws x 2  INDICATIONS:   David Daugherty is a 27 y.o. male who presents with retained hardware after undergoing a Lisfranc fracture dislocation open reduction with internal fixation.  Patient has done well with this and has recovered pretty well thus far and is now around 3 to 4 months out from the initial surgery.  Patient presents today for planned hardware removal after initial procedure.  Risks and complications discussed with patient detail as well as benefits.  All questions answered including postoperative course.  No guarantees given.  Consent obtained prior to procedure..  DESCRIPTION: After obtaining full informed written consent, the patient was brought back to the operating room and placed supine upon the operating table.  The patient received IV antibiotics prior to induction.  After obtaining adequate anesthesia, the patient was prepped and draped in the standard fashion.  A local block was performed to the area of the medial cuneiform with 10 cc of half percent Marcaine plain.  An Esmarch bandage was used to exsanguinate the left lower extremity and pneumatic ankle tourniquet was inflated.  Attention was then directed to the medial cuneiform over the area of the previous incision where an incision was made over the same exact area that was approximately 1 to 2 cm in length.  The incision was deepened to the subcutaneous tissues utilizing sharp blunt dissection  and care was taken to identify and retract all vital neurovascular structures and all venous contributories were cauterized as necessary.  At this time the periosteum was visualized and a periosteal incision was then made into the bone at the medial cuneiform and the periosteum was elevated medially and laterally thereby exposing the bone at the operative site as well as the hardware.  The hardware was then freed up from the bone slightly with a rongeur and the screws were easily able to be visualized.  At this time the [employer] 28 screwdriver was then used to remove the 2 screws present at the operative site.  The screws removed without incident.  C-arm imaging was utilized to verify removal of screws which appeared to be appropriate.  The Lisfranc area was then stressed and noted to have no obvious instability.  The surgical site was flushed with copious amounts of normal sterile saline.  The deep fascia and periosteal structures were reapproximated well coapted with 3-0 Vicryl.  Subcutaneous tissue was reapproximated well coapted with 3-0 Vicryl and the skin was then reapproximated well coapted with 3-0 nylon in horizontal mattress type stitching.  A postoperative dressing was then applied consisting of Xeroform followed by 4 x 4 gauze, gauze roll, Ace wrap, cam boot.  The pneumatic ankle tourniquet was deflated prior to placing the postoperative dressing and a prompt hyperemic response was noted all digits left foot.  Following period of postoperative monitoring the patient be discharged back to the outpatient room with the appropriate orders, instructions, and medications.  Patient is to remain partial weightbearing in the boot for the next 2 weeks.  Patient to follow-up in clinic in 1 week for dressing  change and evaluation of incision.  COMPLICATIONS: None  CONDITION: Good, stable  Rosetta Posner, DPM

## 2023-02-27 NOTE — Anesthesia Procedure Notes (Signed)
Procedure Name: LMA Insertion Date/Time: 02/27/2023 8:45 AM  Performed by: Genia Del, CRNAPre-anesthesia Checklist: Patient identified, Patient being monitored, Timeout performed, Emergency Drugs available and Suction available Patient Re-evaluated:Patient Re-evaluated prior to induction Oxygen Delivery Method: Circle system utilized Preoxygenation: Pre-oxygenation with 100% oxygen Induction Type: IV induction Ventilation: Mask ventilation without difficulty LMA: LMA inserted LMA Size: 4.0 Tube type: Oral Number of attempts: 1 Placement Confirmation: positive ETCO2 and breath sounds checked- equal and bilateral Tube secured with: Tape Dental Injury: Teeth and Oropharynx as per pre-operative assessment  Comments: Ambu Aura Once LMA seated easily with one attempt.

## 2023-02-27 NOTE — Anesthesia Postprocedure Evaluation (Signed)
Anesthesia Post Note  Patient: David Daugherty  Procedure(s) Performed: MINOR HARDWARE REMOVAL - DEEP (Left: Foot)  Patient location during evaluation: PACU Anesthesia Type: General Level of consciousness: awake and alert Pain management: pain level controlled Vital Signs Assessment: post-procedure vital signs reviewed and stable Respiratory status: spontaneous breathing, nonlabored ventilation, respiratory function stable and patient connected to nasal cannula oxygen Cardiovascular status: blood pressure returned to baseline and stable Postop Assessment: no apparent nausea or vomiting Anesthetic complications: no   No notable events documented.   Last Vitals:  Vitals:   02/27/23 0945 02/27/23 0958  BP: 115/63 126/76  Pulse: 71 79  Resp: 15 15  Temp:  (!) 36.4 C  SpO2: 100% 99%    Last Pain:  Vitals:   02/27/23 0958  TempSrc:   PainSc: 0-No pain                 Marisue Humble

## 2023-02-27 NOTE — H&P (Signed)
HISTORY AND PHYSICAL INTERVAL NOTE:  02/27/2023  8:10 AM  David Daugherty  has presented today for surgery, with the diagnosis of S93.325 - Lisfranc dislocation, left S82.52XD - Closed displaced fracture of medial malleolus of left tibia with routine healing; M79.672 - Left foot pain.  The various methods of treatment have been discussed with the patient.  No guarantees were given.  After consideration of risks, benefits and other options for treatment, the patient has consented to surgery.  I have reviewed the patients' chart and labs.    PROCEDURE: LEFT FOOT REMOVAL OF HARDWARE  A history and physical examination was performed in my office.  The patient was reexamined.  There have been no changes to this history and physical examination.  Rosetta Posner, DPM

## 2023-02-28 ENCOUNTER — Encounter: Payer: Self-pay | Admitting: Podiatry
